# Patient Record
Sex: Female | Born: 1975 | Race: White | Hispanic: No | Marital: Married | Smoking: Never smoker
Health system: Southern US, Community
[De-identification: ages and names within clinical notes are randomized; demographics above are authoritative.]

## PROBLEM LIST (undated history)

## (undated) HISTORY — PX: COSMETIC SURGERY: SHX468

---

## 2011-01-27 ENCOUNTER — Encounter: Payer: Self-pay | Admitting: *Deleted

## 2011-01-27 ENCOUNTER — Emergency Department (HOSPITAL_COMMUNITY)
Admission: EM | Admit: 2011-01-27 | Discharge: 2011-01-28 | Disposition: A | Discharge status: Hospice | Payer: Managed Care, Other (non HMO) | Attending: Emergency Medicine | Admitting: Emergency Medicine

## 2011-01-27 DIAGNOSIS — O26859 Spotting complicating pregnancy, unspecified trimester: Secondary | ICD-10-CM | POA: Insufficient documentation

## 2011-01-27 DIAGNOSIS — R110 Nausea: Secondary | ICD-10-CM | POA: Insufficient documentation

## 2011-01-27 DIAGNOSIS — Z3490 Encounter for supervision of normal pregnancy, unspecified, unspecified trimester: Secondary | ICD-10-CM

## 2011-01-27 DIAGNOSIS — N939 Abnormal uterine and vaginal bleeding, unspecified: Secondary | ICD-10-CM

## 2011-01-27 DIAGNOSIS — R300 Dysuria: Secondary | ICD-10-CM | POA: Insufficient documentation

## 2011-01-27 DIAGNOSIS — R109 Unspecified abdominal pain: Secondary | ICD-10-CM | POA: Insufficient documentation

## 2011-01-27 NOTE — ED Notes (Addendum)
~   [redacted] weeks pregnant, 1st noticed spotting Saturday morning, intermittant and small amount, describes as pink, last ~ 1 hr ago, also reports intermittant abd & back pain, also nausea & HA. (denies: vd or fever), took tylenol PTA. No prenatal care yet, 1st appt this Tuesday. BP noted to be high.

## 2011-01-28 ENCOUNTER — Emergency Department (HOSPITAL_COMMUNITY): Payer: Managed Care, Other (non HMO)

## 2011-01-28 LAB — WET PREP, GENITAL
Trich, Wet Prep: NONE SEEN
Yeast Wet Prep HPF POC: NONE SEEN

## 2011-01-28 LAB — URINALYSIS, ROUTINE W REFLEX MICROSCOPIC
Bilirubin Urine: NEGATIVE
Ketones, ur: NEGATIVE mg/dL
Leukocytes, UA: NEGATIVE
Nitrite: NEGATIVE
Specific Gravity, Urine: 1.003 — ABNORMAL LOW (ref 1.005–1.030)
Urobilinogen, UA: 0.2 mg/dL (ref 0.0–1.0)

## 2011-01-28 LAB — GC/CHLAMYDIA PROBE AMP, GENITAL
Chlamydia, DNA Probe: NEGATIVE
GC Probe Amp, Genital: NEGATIVE

## 2011-01-28 LAB — CBC
Hemoglobin: 12.4 g/dL (ref 12.0–15.0)
MCH: 31 pg (ref 26.0–34.0)
MCHC: 34.1 g/dL (ref 30.0–36.0)
Platelets: 208 10*3/uL (ref 150–400)
RBC: 4 MIL/uL (ref 3.87–5.11)

## 2011-01-28 LAB — HCG, QUANTITATIVE, PREGNANCY: hCG, Beta Chain, Quant, S: 14374 m[IU]/mL — ABNORMAL HIGH (ref <5)

## 2011-01-28 LAB — ABO/RH: ABO/RH(D): A POS

## 2011-01-28 MED ORDER — ACETAMINOPHEN 325 MG PO TABS
650.0000 mg | ORAL_TABLET | Freq: Once | ORAL | Status: AC
  Administered 2011-01-28: 650 mg via ORAL
  Filled 2011-01-28: qty 2

## 2011-01-28 MED ORDER — ACETAMINOPHEN 500 MG PO TABS
500.0000 mg | ORAL_TABLET | Freq: Once | ORAL | Status: DC
  Filled 2011-01-28: qty 1

## 2011-01-28 NOTE — ED Notes (Signed)
Patient is resting comfortably. 

## 2011-01-28 NOTE — ED Notes (Signed)
Family at bedside. 

## 2011-01-28 NOTE — ED Provider Notes (Signed)
History     CSN: [id number] Arrival date & time: 01/27/2011 11:21 PM   First MD Initiated Contact with Patient 01/27/11 2350      Chief Complaint  Patient presents with  . Vaginal Bleeding    (Consider location/radiation/quality/duration/timing/severity/associated sxs/prior treatment) HPI Comments: 35 year old G3 P2 002 at approximately [redacted] weeks gestation presents with intermittent vaginal spotting over the last 24 hours, gradually getting worse, associated with dysuria and abdominal pain and nausea. States she has not had an OB/GYN appointment at this time  Patient is a 35 y.o. female presenting with vaginal bleeding. The history is provided by the patient and the spouse. The history is limited by a language barrier.  Vaginal Bleeding This is a new problem. The current episode started yesterday. The problem occurs hourly. The problem has been gradually worsening. Associated symptoms include abdominal pain (Suprapubic). Pertinent negatives include no chest pain, no headaches and no shortness of breath. Exacerbated by: Urination. The symptoms are relieved by nothing. She has tried acetaminophen for the symptoms. The treatment provided no relief.    History reviewed. No pertinent past medical history.  Past Surgical History  Procedure Date  . Cosmetic surgery     Family History  Problem Relation Age of Onset  . Diabetes Other     History  Substance Use Topics  . Smoking status: Never Smoker   . Smokeless tobacco: Not on file  . Alcohol Use: No    OB History    Grav Para Term Preterm Abortions TAB SAB Ect Mult Living                  Review of Systems  Respiratory: Negative for shortness of breath.   Cardiovascular: Negative for chest pain.  Gastrointestinal: Positive for abdominal pain (Suprapubic).  Genitourinary: Positive for vaginal bleeding.  Neurological: Negative for headaches.  All other systems reviewed and are negative.    Allergies  Review of patient's  allergies indicates no known allergies.  Home Medications   Current Outpatient Rx  Name Route Sig Dispense Refill  . ACETAMINOPHEN 500 MG PO TABS Oral Take 500 mg by mouth every 6 (six) hours as needed. For pain     . PRENATAL 1 PO Oral Take 1 tablet by mouth daily.        BP 158/81  Pulse 73  Temp(Src) 97.8 F (36.6 C) (Oral)  Resp 18  SpO2 100%  LMP 11/30/2010  Physical Exam  Nursing note and vitals reviewed. Constitutional: She appears well-developed and well-nourished. No distress.  HENT:  Head: Normocephalic and atraumatic.  Mouth/Throat: Oropharynx is clear and moist. No oropharyngeal exudate.  Eyes: Conjunctivae and EOM are normal. Pupils are equal, round, and reactive to light. Right eye exhibits no discharge. Left eye exhibits no discharge. No scleral icterus.  Neck: Normal range of motion. Neck supple. No JVD present. No thyromegaly present.  Cardiovascular: Normal rate, regular rhythm, normal heart sounds and intact distal pulses.  Exam reveals no gallop and no friction rub.   No murmur heard. Pulmonary/Chest: Effort normal and breath sounds normal. No respiratory distress. She has no wheezes. She has no rales.  Abdominal: Soft. Bowel sounds are normal. She exhibits no distension and no mass. There is tenderness ( Mild tenderness to palpation in the suprapubic area).       No right lower quadrant, right upper quadrant tenderness to palpation. Non-peritoneal, no guarding  Genitourinary:       No CVA tenderness.  Chaperone present for pelvic exam, no  cervical motion tenderness, mild bilateral adnexal tenderness, spot of blood at the cervical os, no discharge, cervical os is closed.  Musculoskeletal: Normal range of motion. She exhibits no edema and no tenderness.  Lymphadenopathy:    She has no cervical adenopathy.  Neurological: She is alert. Coordination normal.  Skin: Skin is warm and dry. No rash noted. No erythema.  Psychiatric: She has a normal mood and affect.  Her behavior is normal.    ED Course  Procedures (including critical care time)  Labs Reviewed  HCG, QUANTITATIVE, PREGNANCY - Abnormal; Notable for the following:    hCG, Beta Chain, Quant, S 14374 (*)    All other components within normal limits  URINALYSIS, ROUTINE W REFLEX MICROSCOPIC - Abnormal; Notable for the following:    Color, Urine STRAW (*)    Specific Gravity, Urine 1.003 (*)    Hgb urine dipstick SMALL (*)    All other components within normal limits  WET PREP, GENITAL - Abnormal; Notable for the following:    Clue Cells, Wet Prep FEW (*)    WBC, Wet Prep HPF POC FEW (*)    All other components within normal limits  ABO/RH  CBC  URINE MICROSCOPIC-ADD ON  URINE CULTURE  GC/CHLAMYDIA PROBE AMP, GENITAL   Results for orders placed during the hospital encounter of 01/27/11  ABO/RH      Component Value Range   ABO/RH(D) A POS    CBC      Component Value Range   WBC 7.2  4.0 - 10.5 (K/uL)   RBC 4.00  3.87 - 5.11 (MIL/uL)   Hemoglobin 12.4  12.0 - 15.0 (g/dL)   HCT 45.4  09.8 - 11.9 (%)   MCV 91.0  78.0 - 100.0 (fL)   MCH 31.0  26.0 - 34.0 (pg)   MCHC 34.1  30.0 - 36.0 (g/dL)   RDW 14.7  82.9 - 56.2 (%)   Platelets 208  150 - 400 (K/uL)  HCG, QUANTITATIVE, PREGNANCY      Component Value Range   hCG, Beta Chain, Quant, S 14374 (*) <5 (mIU/mL)  URINALYSIS, ROUTINE W REFLEX MICROSCOPIC      Component Value Range   Color, Urine STRAW (*) YELLOW    Appearance CLEAR  CLEAR    Specific Gravity, Urine 1.003 (*) 1.005 - 1.030    pH 7.0  5.0 - 8.0    Glucose, UA NEGATIVE  NEGATIVE (mg/dL)   Hgb urine dipstick SMALL (*) NEGATIVE    Bilirubin Urine NEGATIVE  NEGATIVE    Ketones, ur NEGATIVE  NEGATIVE (mg/dL)   Protein, ur NEGATIVE  NEGATIVE (mg/dL)   Urobilinogen, UA 0.2  0.0 - 1.0 (mg/dL)   Nitrite NEGATIVE  NEGATIVE    Leukocytes, UA NEGATIVE  NEGATIVE   WET PREP, GENITAL      Component Value Range   Yeast, Wet Prep NONE SEEN  NONE SEEN    Trich, Wet Prep NONE  SEEN  NONE SEEN    Clue Cells, Wet Prep FEW (*) NONE SEEN    WBC, Wet Prep HPF POC FEW (*) NONE SEEN   URINE MICROSCOPIC-ADD ON      Component Value Range   Squamous Epithelial / LPF RARE  RARE    WBC, UA 0-2  <3 (WBC/hpf)   RBC / HPF 0-2  <3 (RBC/hpf)   Bacteria, UA RARE  RARE     US Ob Transvaginal  01/28/2011  *RADIOLOGY REPORT*  Clinical Data: Pregnant, pain, evaluate for ectopic pregnancy  OBSTETRIC <14 WK [place] AND TRANSVAGINAL OB US  Technique:  Both transabdominal and transvaginal ultrasound examinations were performed for complete evaluation of the gestation as well as the maternal uterus, adnexal regions, and pelvic cul-de-sac.  Transvaginal technique was performed to assess early pregnancy.  Comparison:  None.  Intrauterine gestational sac:  Visualized/normal in shape. Yolk sac: Present Embryo: Present Cardiac Activity: Documented Heart Rate: 171 bpm  CRL: 14.8   mm  7   w  6   d        [place] EDC: 09/10/2011  Maternal uterus/adnexae: Ovaries have a normal sonographic appearance, measuring 3.8 x 1.8 x 1.4 cm on the right and 2.5 x 1.5 x 1.6 cm on the left.  Two uterine fibroids are present, the largest of which is subserosal, measuring 5.0 x 3.9 cm.  The other appears to be intramural.  Small amount of free fluid.  No adnexal mass.  IMPRESSION: Single intrauterine gestation identified, with estimated age of 7 weeks 6 days by crown-rump length.  Cardiac activity documented.  Uterine fibroids.  Small amount of free fluid.  Original Report Authenticated By:        1. Abnormal vaginal bleeding   2. Normal IUP (intrauterine pregnancy) on prenatal ultrasound       MDM  Well-appearing female with focal suprapubic tenderness, bedside ultrasound revealing likely IUP, lab work, urinalysis, pelvic exam revealing nonspecific bilateral adnexal tenderness. Ultrasound pending to confirm IUP rule out ectopic pregnancy  Patient has improved with Tylenol, vital signs normal, intrauterine pregnancy  confirmed by official ultrasound. Urinalysis without signs of infection. Patient given followup instructions and understanding expressed by patient and family     Vida Roller, MD 01/28/11 [phone]

## 2011-01-29 LAB — URINE CULTURE

## 2011-02-03 ENCOUNTER — Emergency Department (HOSPITAL_COMMUNITY): Payer: Managed Care, Other (non HMO)

## 2011-02-03 ENCOUNTER — Emergency Department (HOSPITAL_COMMUNITY)
Admission: EM | Admit: 2011-02-03 | Discharge: 2011-02-03 | Disposition: A | Discharge status: Home / Self Care | Payer: Managed Care, Other (non HMO) | Attending: Emergency Medicine | Admitting: Emergency Medicine

## 2011-02-03 ENCOUNTER — Encounter (HOSPITAL_COMMUNITY): Payer: Self-pay | Admitting: Emergency Medicine

## 2011-02-03 DIAGNOSIS — R109 Unspecified abdominal pain: Secondary | ICD-10-CM | POA: Insufficient documentation

## 2011-02-03 DIAGNOSIS — O039 Complete or unspecified spontaneous abortion without complication: Secondary | ICD-10-CM

## 2011-02-03 LAB — URINE MICROSCOPIC-ADD ON

## 2011-02-03 LAB — URINALYSIS, ROUTINE W REFLEX MICROSCOPIC
Glucose, UA: NEGATIVE mg/dL
Protein, ur: NEGATIVE mg/dL
Specific Gravity, Urine: 1.004 — ABNORMAL LOW (ref 1.005–1.030)
pH: 7 (ref 5.0–8.0)

## 2011-02-03 LAB — POCT I-STAT, CHEM 8
Calcium, Ion: 1.23 mmol/L (ref 1.12–1.32)
Creatinine, Ser: 0.7 mg/dL (ref 0.50–1.10)
Glucose, Bld: 111 mg/dL — ABNORMAL HIGH (ref 70–99)
Hemoglobin: 13.6 g/dL (ref 12.0–15.0)
Potassium: 4 mEq/L (ref 3.5–5.1)

## 2011-02-03 MED ORDER — HYDROCODONE-ACETAMINOPHEN 5-325 MG PO TABS
2.0000 | ORAL_TABLET | Freq: Once | ORAL | Status: AC
  Administered 2011-02-03: 2 via ORAL
  Filled 2011-02-03: qty 2

## 2011-02-03 MED ORDER — HYDROCODONE-ACETAMINOPHEN 5-325 MG PO TABS: 1.0000 | ORAL_TABLET | ORAL | Status: AC | PRN

## 2011-02-03 NOTE — ED Notes (Signed)
Pt c/o lower abd/back pain x1 day, abnormal vag. Bleeding x6 days, and passing lg blood clots x2-3 days, pt LNMP 11/30/2010, pt sts she is [redacted] wks pregnant

## 2011-02-03 NOTE — ED Notes (Signed)
C/o increased abd pain/cramping

## 2011-02-03 NOTE — ED Provider Notes (Signed)
History     CSN: [id number] Arrival date & time: 02/03/2011  5:20 PM   First MD Initiated Contact with Patient 02/03/11 1845      Chief Complaint  Patient presents with  . Vaginal Bleeding     Patient is a 35 y.o. female presenting with vaginal bleeding. The history is provided by the patient and the spouse.  Vaginal Bleeding This is a new problem. The current episode started in the past 7 days. The problem occurs constantly. The problem has been gradually worsening. Associated symptoms include abdominal pain. Pertinent negatives include no nausea or vomiting. She has tried nothing for the symptoms. The treatment provided no relief.  Reports she is approximately [redacted] weeks pregnant. Seen here on the 19th for spotting and vaginal bleeding. Reports increased vaginal bleeding over the last 24 hours it has been associated with large clots. Increased bleeding has been associated with increased lower abdominal cramping.   History reviewed. No pertinent past medical history.  Past Surgical History  Procedure Date  . Cosmetic surgery     Family History  Problem Relation Age of Onset  . Diabetes Other     History  Substance Use Topics  . Smoking status: Never Smoker   . Smokeless tobacco: Not on file  . Alcohol Use: No    OB History    Grav Para Term Preterm Abortions TAB SAB Ect Mult Living   3               Review of Systems  Constitutional: Negative.   HENT: Negative.   Eyes: Negative.   Respiratory: Negative.   Cardiovascular: Negative.   Gastrointestinal: Positive for abdominal pain. Negative for nausea and vomiting.  Genitourinary: Positive for vaginal bleeding.  Musculoskeletal: Negative.   Skin: Negative.   Neurological: Negative.   Hematological: Negative.   Psychiatric/Behavioral: Negative.     Allergies  Review of patient's allergies indicates no known allergies.  Home Medications   Current Outpatient Rx  Name Route Sig Dispense Refill  .  ACETAMINOPHEN 500 MG PO TABS Oral Take 500 mg by mouth every 6 (six) hours as needed. For pain     . PRENATAL 1 PO Oral Take 1 tablet by mouth daily.        BP 158/90  Pulse 101  Temp(Src) 97.3 F (36.3 C) (Oral)  Resp 22  SpO2 99%  LMP 11/30/2010  Physical Exam  Constitutional: She is oriented to person, place, and time. She appears well-developed and well-nourished.  HENT:  Head: Normocephalic and atraumatic.  Eyes: Conjunctivae are normal.  Neck: Neck supple.  Cardiovascular: Normal rate and regular rhythm.   Pulmonary/Chest: Effort normal and breath sounds normal.  Abdominal: Soft. Bowel sounds are normal.  Genitourinary: Pelvic exam was performed with patient supine.       Moderate amount br red bleeding noted w/ small clots and small fragments of tissue like material c/w POC.  Musculoskeletal: Normal range of motion.  Neurological: She is alert and oriented to person, place, and time.  Skin: Skin is warm and dry.  Psychiatric: She has a normal mood and affect.    ED Course  Procedures U/S shows no IUP as compared w/ U/S on 01/28/2011. Thickened endometrium. Will consult w/ GYN on call for plan. Findings discussed w/ pt and spouse.   Spoke w/ Dr Leda Quail who has recommended d/c home w/ bleeding precautions and close f/u w/ GYN she was scheduled to see. Discussed plan w/ pt and spouse who are  agreeable w/ plan.  Labs Reviewed  URINALYSIS, ROUTINE W REFLEX MICROSCOPIC - Abnormal; Notable for the following:    Specific Gravity, Urine 1.004 (*)    Hgb urine dipstick LARGE (*)    Leukocytes, UA TRACE (*)    All other components within normal limits  POCT I-STAT, CHEM 8 - Abnormal; Notable for the following:    Glucose, Bld 111 (*)    All other components within normal limits  URINE MICROSCOPIC-ADD ON - Abnormal; Notable for the following:    Squamous Epithelial / LPF FEW (*)    All other components within normal limits  URINE CULTURE  I-STAT, CHEM 8   US Ob Comp  Less 14 Wks  02/03/2011  *RADIOLOGY REPORT*  Clinical Data: Vaginal bleeding.  Cramping.  Pregnant.  OBSTETRIC <14 WK [place] AND TRANSVAGINAL OB US  Technique:  Both transabdominal and transvaginal ultrasound examinations were performed for complete evaluation of the gestation as well as the maternal uterus, adnexal regions, and pelvic cul-de-sac.  Transvaginal technique was performed to assess early pregnancy.  Comparison: 01/28/2011.  It was necessary to proceed with endovaginal exam following the transabdominal exam to visualize the endometrium.  Findings: There is no intrauterine gestational sac identified compatible with missed abortion.  Debris is noted within the endometrial canal. Endometrial thickness measures up to 11 mm. Posterior fundal fibroid is present measuring 49 mm x 38 mm x 44 mm.  No adnexal mass is identified.  Right ovary measures 28 mm x 16 mm x 22 mm.  Possible small right involuting corpus luteum cyst. The left ovary measures 20 mm x 11 mm x 15 mm.  IMPRESSION:  No intrauterine gestational sac identified, which was present on the recent prior examination.  Findings are compatible with missed abortion / failed early pregnancy. Endometrial thickening measuring up to 11 mm.  Retained products of conception not excluded.  Original Report Authenticated By: Andreas Newport, M.D.   US Ob Transvaginal  02/03/2011  *RADIOLOGY REPORT*  Clinical Data: Vaginal bleeding.  Cramping.  Pregnant.  OBSTETRIC <14 WK [place] AND TRANSVAGINAL OB US  Technique:  Both transabdominal and transvaginal ultrasound examinations were performed for complete evaluation of the gestation as well as the maternal uterus, adnexal regions, and pelvic cul-de-sac.  Transvaginal technique was performed to assess early pregnancy.  Comparison: 01/28/2011.  It was necessary to proceed with endovaginal exam following the transabdominal exam to visualize the endometrium.  Findings: There is no intrauterine gestational sac identified  compatible with missed abortion.  Debris is noted within the endometrial canal. Endometrial thickness measures up to 11 mm. Posterior fundal fibroid is present measuring 49 mm x 38 mm x 44 mm.  No adnexal mass is identified.  Right ovary measures 28 mm x 16 mm x 22 mm.  Possible small right involuting corpus luteum cyst. The left ovary measures 20 mm x 11 mm x 15 mm.  IMPRESSION:  No intrauterine gestational sac identified, which was present on the recent prior examination.  Findings are compatible with missed abortion / failed early pregnancy. Endometrial thickening measuring up to 11 mm.  Retained products of conception not excluded.  Original Report Authenticated By: Andreas Newport, M.D.     No diagnosis found.    MDM  HPI, clinical course and PE c/w complete miscarriage. Pt is A +.        Leanne Chang, NP 02/03/11 [phone]

## 2011-02-03 NOTE — ED Notes (Signed)
C/o vaginal bleeding since Tuesday.  Reports clots since yesterday and feeling like she is having contractions since yesterday.  Pt reports being [redacted] weeks pregnant.

## 2011-02-04 NOTE — ED Provider Notes (Signed)
Medical screening examination/treatment/procedure(s) were performed by non-physician practitioner and as supervising physician I was immediately available for consultation/collaboration.  Juliet Rude. Rubin Payor, MD 02/04/11 1610

## 2011-02-05 LAB — URINE CULTURE
Colony Count: 6000
Culture  Setup Time: 201211260416

## 2012-04-15 ENCOUNTER — Ambulatory Visit: Payer: Self-pay | Admitting: Obstetrics and Gynecology

## 2012-04-15 LAB — CBC WITH DIFFERENTIAL/PLATELET
Basophil #: 0 10*3/uL (ref 0.0–0.1)
Basophil %: 0.6 %
Eosinophil #: 0.1 10*3/uL (ref 0.0–0.7)
Eosinophil %: 1.1 %
Lymphocyte #: 1.5 10*3/uL (ref 1.0–3.6)
Lymphocyte %: 19.6 %
Monocyte #: 0.4 x10 3/mm (ref 0.2–0.9)
Neutrophil #: 5.8 10*3/uL (ref 1.4–6.5)
Platelet: 220 10*3/uL (ref 150–440)

## 2012-04-16 ENCOUNTER — Inpatient Hospital Stay: Payer: Self-pay | Admitting: Obstetrics and Gynecology

## 2012-04-17 LAB — CBC WITH DIFFERENTIAL/PLATELET
Basophil %: 0.7 %
HCT: 20.7 % — ABNORMAL LOW (ref 35.0–47.0)
Monocyte %: 4.4 %
Neutrophil %: 80.2 %
Platelet: 165 10*3/uL (ref 150–440)
RBC: 2.18 10*6/uL — ABNORMAL LOW (ref 3.80–5.20)

## 2012-04-18 LAB — CBC WITH DIFFERENTIAL/PLATELET
Basophil #: 0 10*3/uL (ref 0.0–0.1)
Eosinophil #: 0.1 10*3/uL (ref 0.0–0.7)
Eosinophil %: 1.3 %
HGB: 7.1 g/dL — ABNORMAL LOW (ref 12.0–16.0)
Lymphocyte #: 1.7 10*3/uL (ref 1.0–3.6)

## 2012-09-04 IMAGING — US US OB COMP LESS 14 WK
1 series · 14 of 28 positions shown · non-contrast
Comparison: None.

CLINICAL DATA: Pregnant, pain, evaluate for ectopic pregnancy

OBSTETRIC <14 WK US AND TRANSVAGINAL OB US
TECHNIQUE: Both transabdominal and transvaginal ultrasound
examinations were performed for complete evaluation of the
gestation as well as the maternal uterus, adnexal regions, and
pelvic cul-de-sac.  Transvaginal technique was performed to assess
early pregnancy.

[Series 1: us ob comp less 14 wk · 0.26mm/px · 14 of 38 slices shown]
[im 2/38]
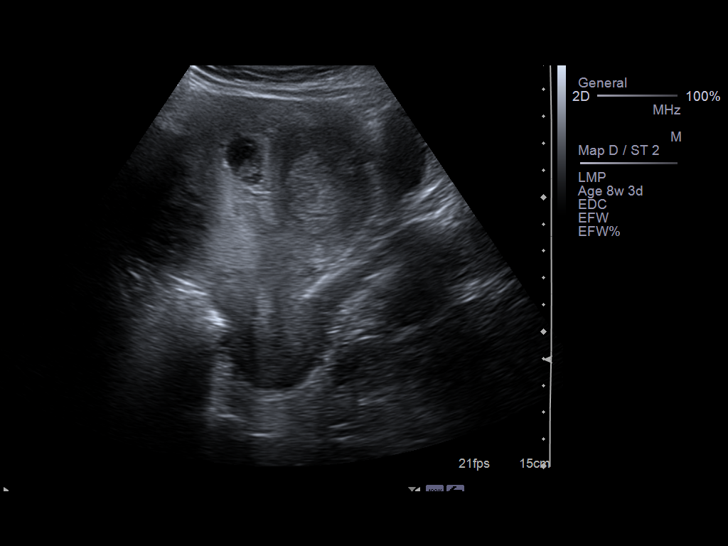
[im 5/38]
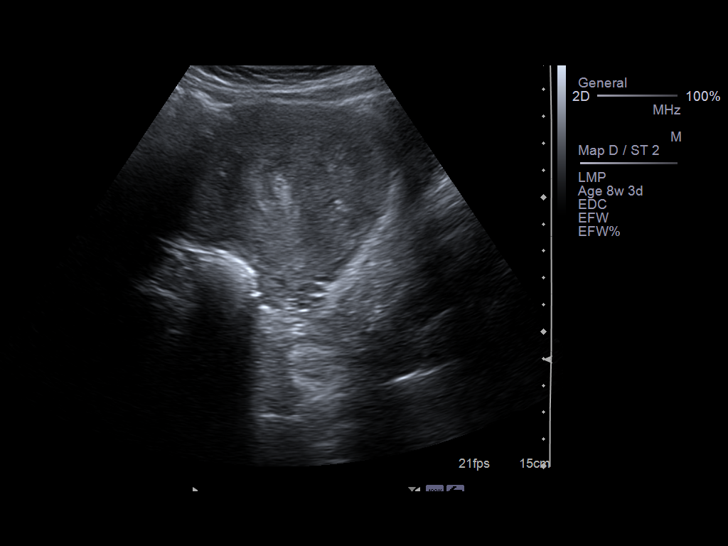
[im 7/38]
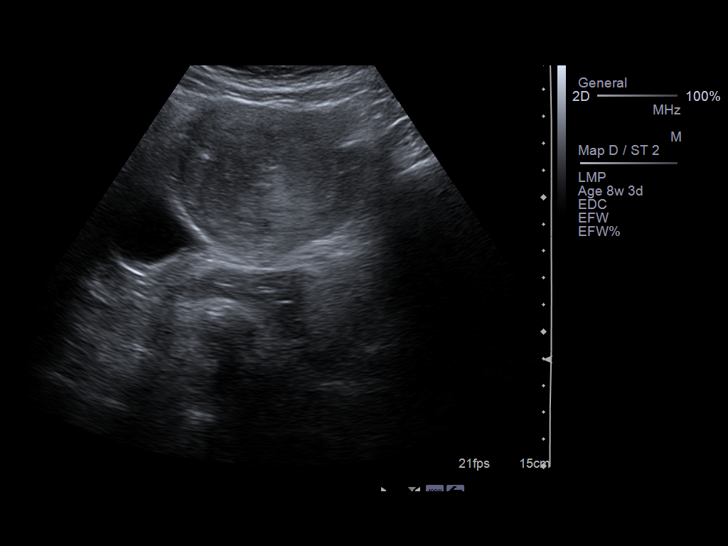
[im 10/38]
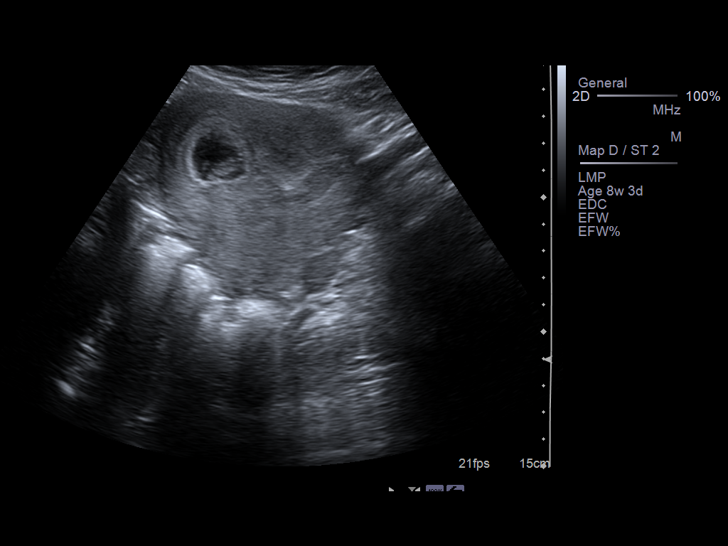
[im 13/38]
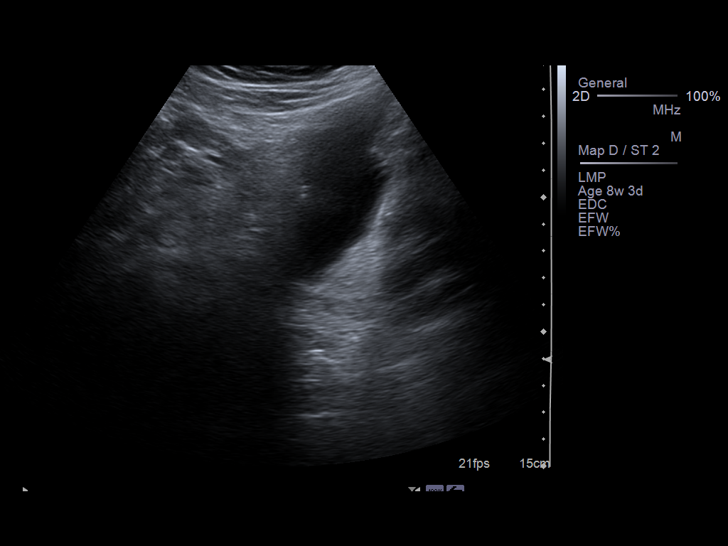
[im 16/38]
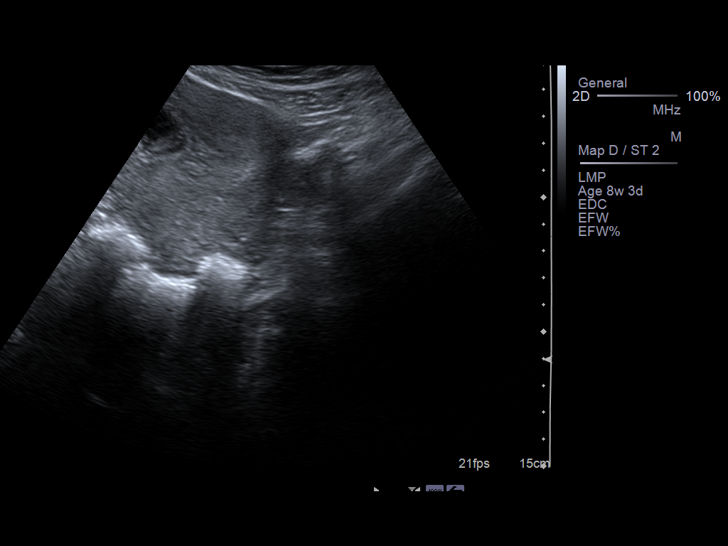
[im 18/38]
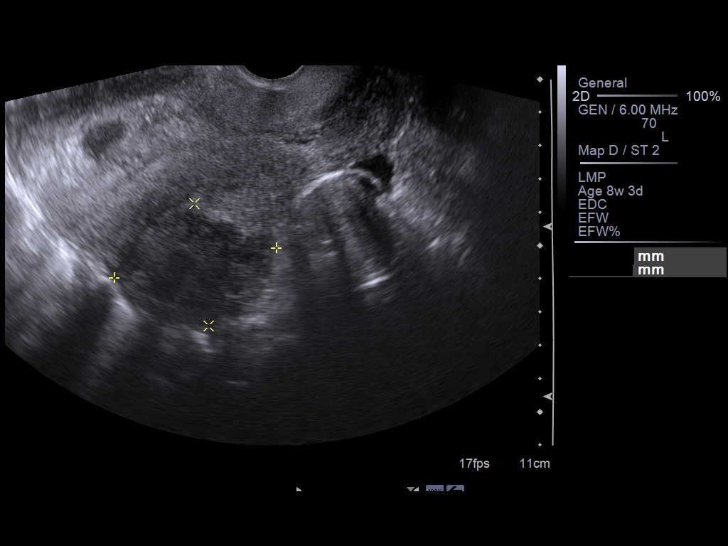
[im 21/38]
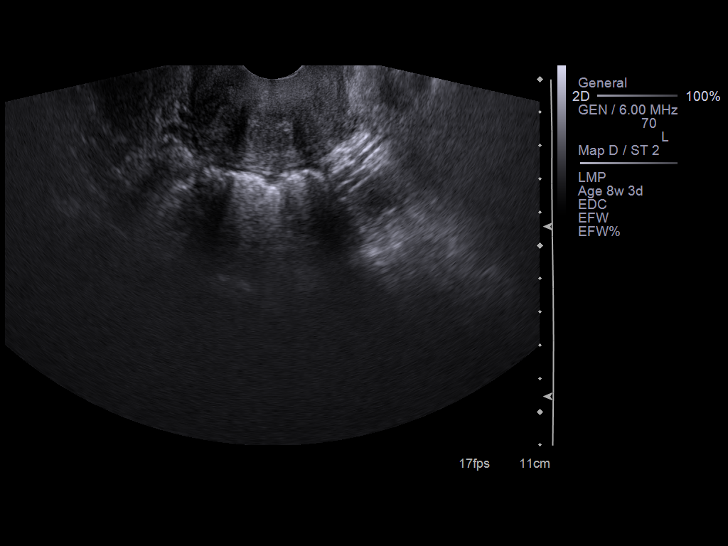
[im 24/38]
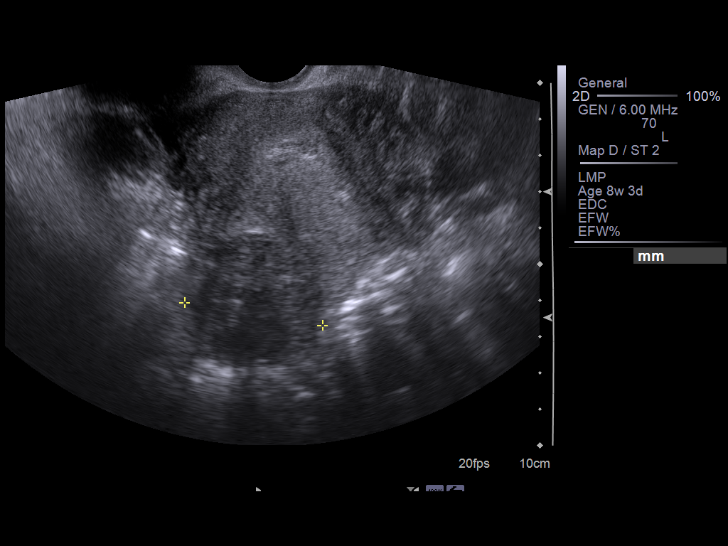
[im 27/38]
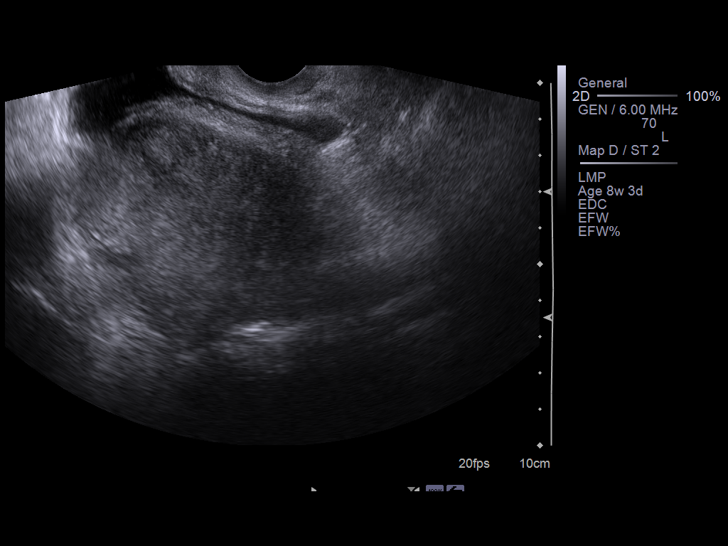
[im 29/38]
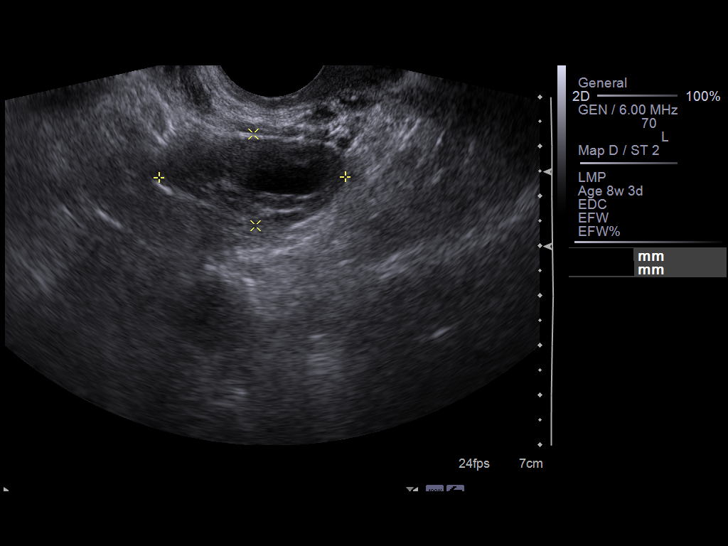
[im 32/38]
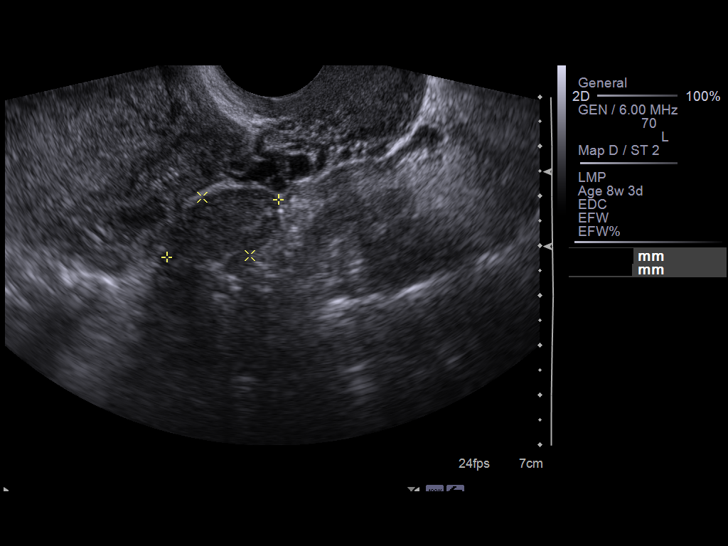
[im 35/38]
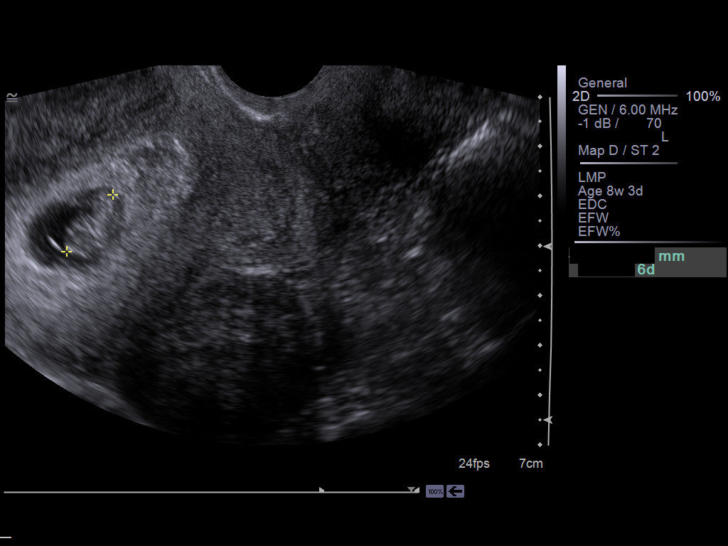
[im 38/38]
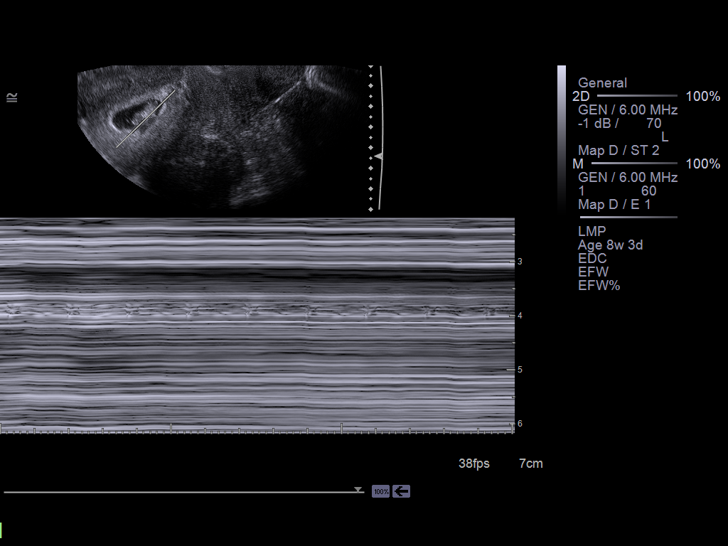

[14 of 28 positions shown; findings below may reference images not displayed]

Intrauterine gestational sac:  Visualized/normal in shape.
Yolk sac: Present
Embryo: Present
Cardiac Activity: Documented
Heart Rate: 171 bpm

CRL: 14.8   mm  7   w  6   d        US EDC: 09/10/2011

Maternal uterus/adnexae:
Ovaries have a normal sonographic appearance, measuring 3.8 x 1.8 x
1.4 cm on the right and 2.5 x 1.5 x 1.6 cm on the left.  Two
uterine fibroids are present, the largest of which is subserosal,
measuring 5.0 x 3.9 cm.  The other appears to be intramural.

Small amount of free fluid.  No adnexal mass.
IMPRESSION: Single intrauterine gestation identified, with estimated age of 7
weeks 6 days by crown-rump length.  Cardiac activity documented.

Uterine fibroids.  Small amount of free fluid.

## 2012-09-10 IMAGING — US US OB TRANSVAGINAL
1 series · 13 of 28 positions shown · non-contrast
Comparison: 01/28/2011.

CLINICAL DATA: Vaginal bleeding.  Cramping.  Pregnant.

OBSTETRIC <14 WK US AND TRANSVAGINAL OB US
TECHNIQUE: Both transabdominal and transvaginal ultrasound
examinations were performed for complete evaluation of the
gestation as well as the maternal uterus, adnexal regions, and
pelvic cul-de-sac.  Transvaginal technique was performed to assess
early pregnancy.

[Series 1: us ob transvaginal · 0.26mm/px · 13 of 39 slices shown]
[im 2/39]
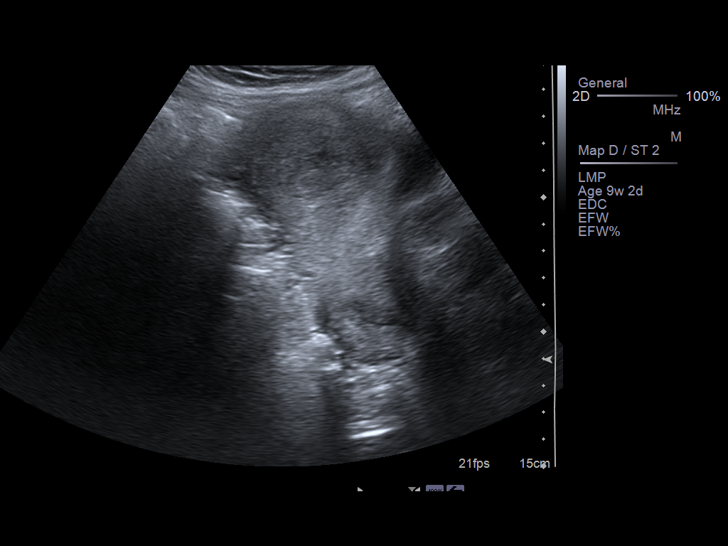
[im 5/39]
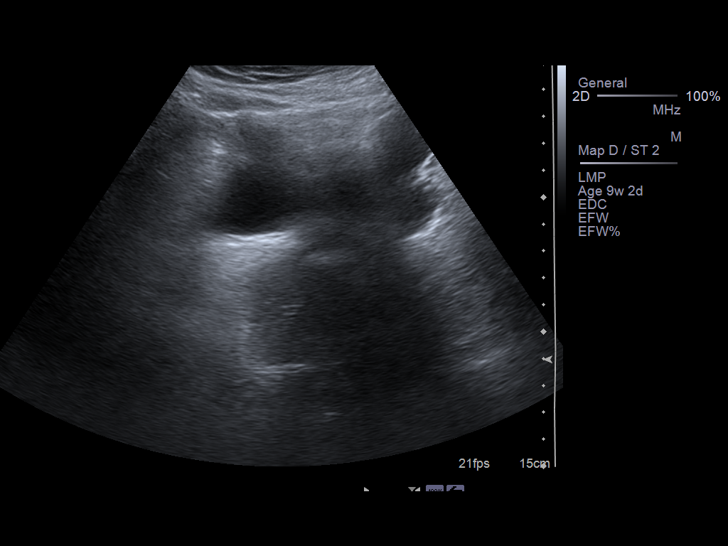
[im 8/39]
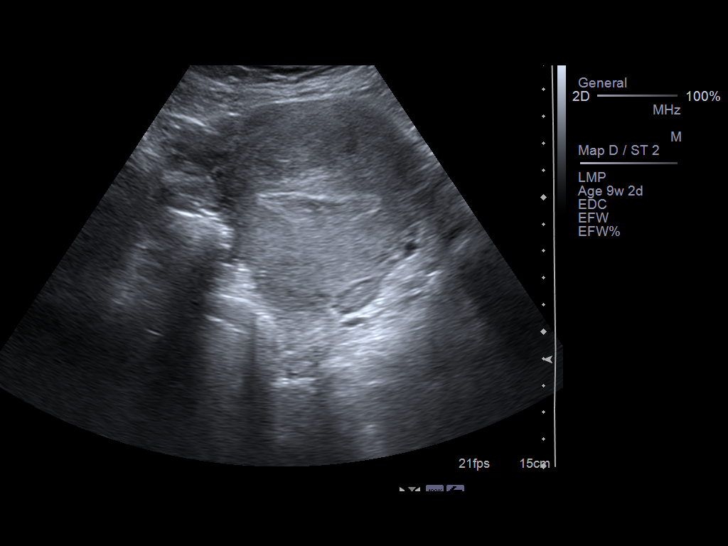
[im 10/39]
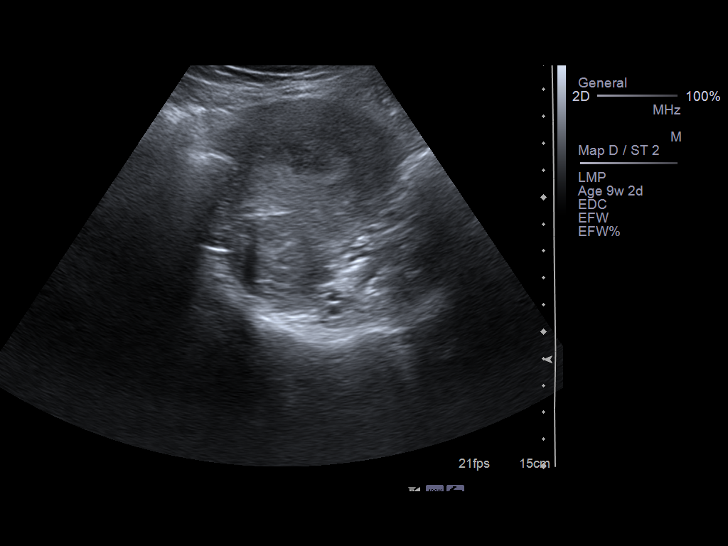
[im 13/39]
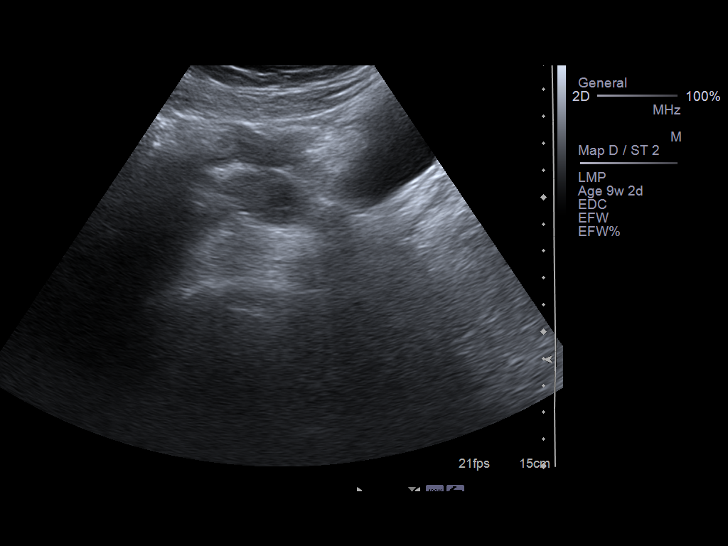
[im 16/39]
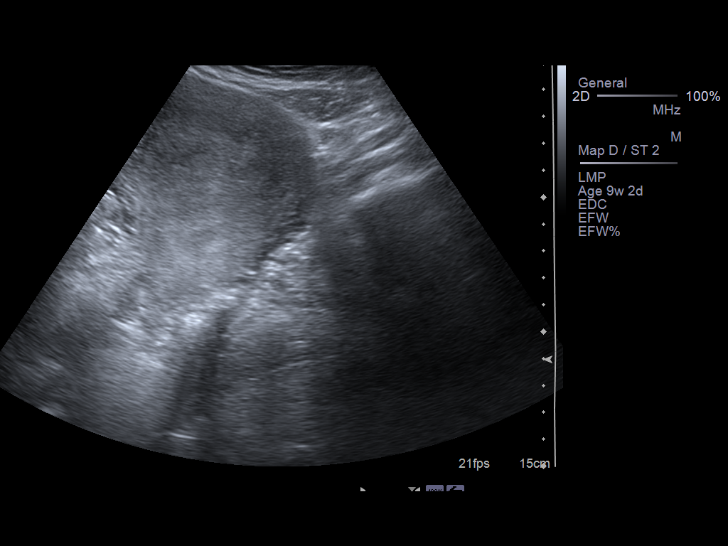
[im 20/39]
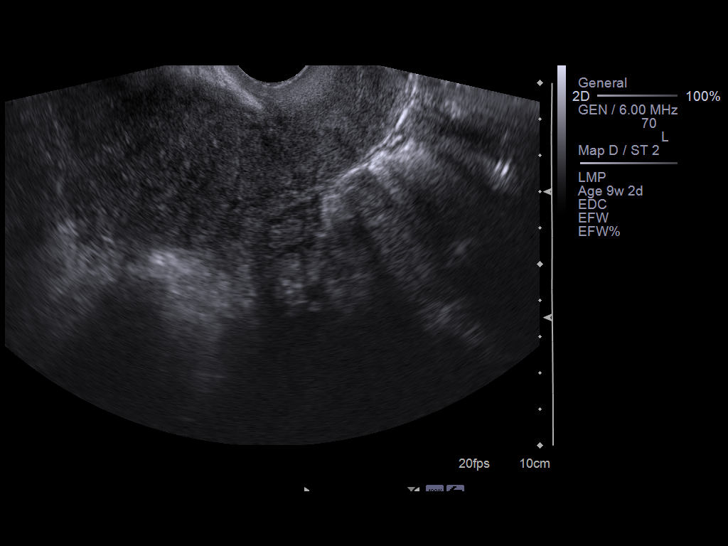
[im 23/39]
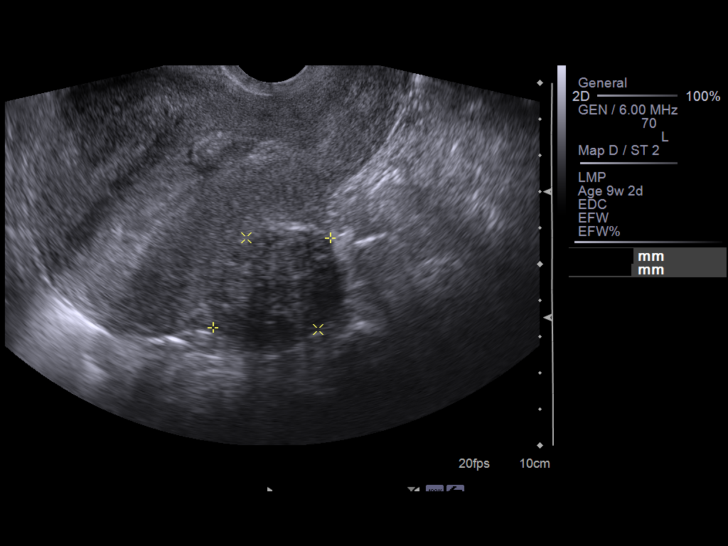
[im 26/39]
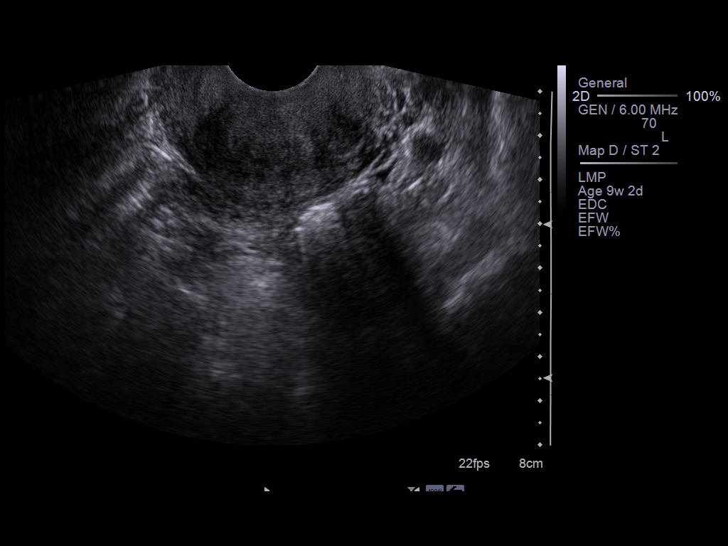
[im 29/39]
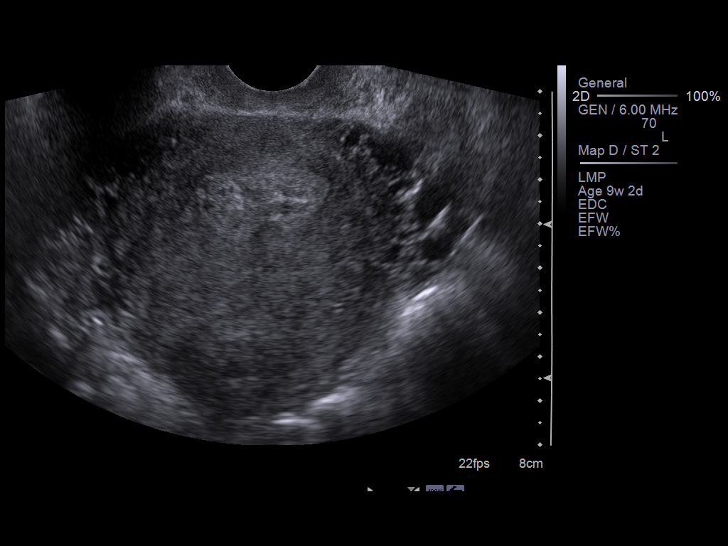
[im 31/39]
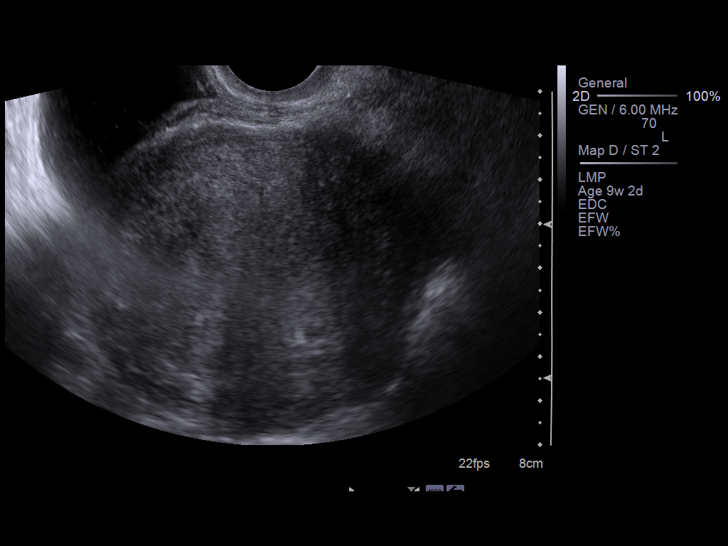
[im 34/39]
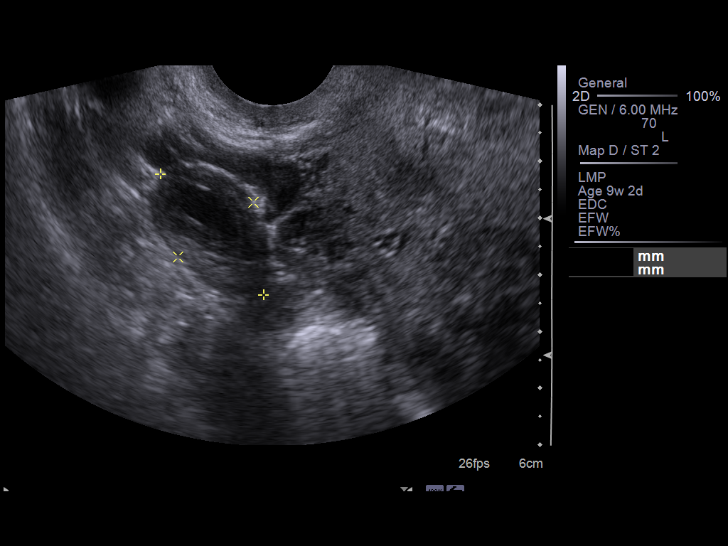
[im 37/39]
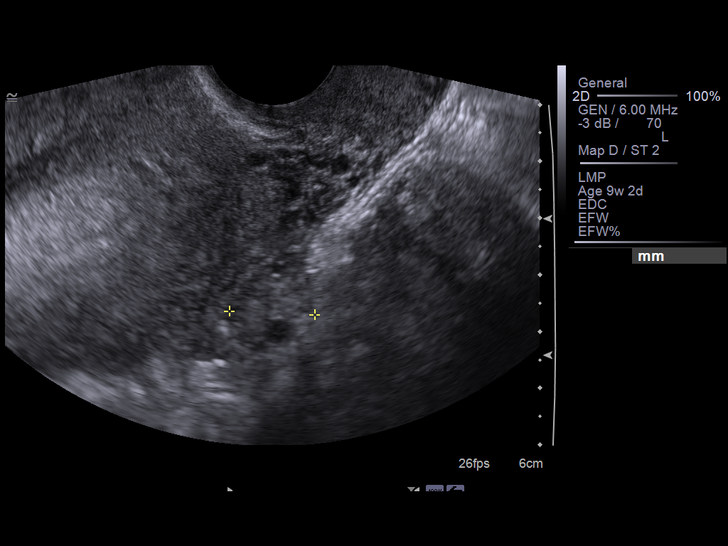

[13 of 28 positions shown; findings below may reference images not displayed]

It was necessary to proceed with endovaginal exam following the
transabdominal exam to visualize the endometrium.
FINDINGS: There is no intrauterine gestational sac identified
compatible with missed abortion.  Debris is noted within the
endometrial canal. Endometrial thickness measures up to 11 mm.
Posterior fundal fibroid is present measuring 49 mm x 38 mm x 44
mm.  No adnexal mass is identified.  Right ovary measures 28 mm x
16 mm x 22 mm.  Possible small right involuting corpus luteum cyst.
The left ovary measures 20 mm x 11 mm x 15 mm.
IMPRESSION: No intrauterine gestational sac identified, which was present on
the recent prior examination.  Findings are compatible with missed
abortion / failed early pregnancy. Endometrial thickening measuring
up to 11 mm.  Retained products of conception not excluded.

## 2014-01-10 ENCOUNTER — Encounter (HOSPITAL_COMMUNITY): Payer: Self-pay | Admitting: Emergency Medicine

## 2014-07-01 NOTE — Op Note (Signed)
PATIENT NAME:  Dorothy Jensen, Dorothy Jensen MR#:  [id number] DATE OF BIRTH:  [birth year]  DATE OF PROCEDURE:  04/16/2012  PREOPERATIVE DIAGNOSES: 721. 39 year old gravida 4, para 2-0-1-2 at [redacted] weeks gestation.  2. History of prior birth trauma and what sounds like possible shoulder dystocia.   POSTOPERATIVE DIAGNOSES:  771. 39 year old gravida 4, para 2-0-1-2 at [redacted] weeks gestation.  2. History of prior birth trauma and what sounds like possible shoulder dystocia.   PROCEDURE PERFORMED: Low transverse cesarean section via Pfannenstiel skin incision.   SURGEON: Vena AustriaAndreas Athene Schuhmacher, MD  ASSISTANT: Senaida LangeLashawn Weaver-Lee, MD   ANESTHESIA USED: Spinal.  ESTIMATED BLOOD LOSS: 600 mL.   IV FLUIDS: 1 liter.   URINE OUTPUT: 50 mL of clear urine.   DRAINS OR TUBES: Foley to gravity drainage, On-Q catheter system.   IMPLANTS: None.   SPECIMENS REMOVED: None.   INTRAOPERATIVE FINDINGS: Normal tubes and ovaries, a posterior fibroid measuring approximately 4 cm and several small subcentimeter subserosal fibroids were also seen. Delivery resulted in the birth of a liveborn female infant weighing 3300 grams, 7 pounds 4 ounces, Apgars 9 and 9.  COMPLICATIONS: None.   PATIENT CONDITION FOLLOWING PROCEDURE: Stable.   PROCEDURE IN DETAIL: Risks, benefits and alternatives of the procedure were discussed with the patient prior to proceeding to the operating room. The patient was taken to the operating room where spinal anesthesia was administered. She was positioned in the supine position, prepped and draped in the usual sterile fashion. A timeout procedure was performed and level of anesthetic was noted to be acceptable. A Pfannenstiel skin incision was then made 2 cm above the pubic symphysis and carried down sharply to the level of the rectus fascia. The fascia was incised in the midline using a knife and the fascial incision extended using Mayo scissors. The superior border of the rectus fascia was then grasped with two  Kocher clamps and the fascia was dissected off the underlying rectus muscle using blunt dissection, the median raphe was incised using Mayo scissors. The inferior border of the rectus fascia was dissected off the rectus muscle in a similar fashion. The midline was identified. The peritoneum was entered bluntly. The peritoneal incision was extended using manual traction. A bladder blade was placed and a bladder flap was created using Metzenbaum scissors and digital dissection. The bladder blade was then replaced displacing the bladder caudad. A low transverse uterine incision was then made using a #10 scalpel. The uterus was entered bluntly using the operator's finger. Clear fluid was noted. Upon placing the operator's hand into the hysterotomy incision, the infant was noted to be in the OA position. The vertex was grasped, flexed, brought to the incision and delivered atraumatically using fundal pressure. Cord was clamped and cut. The infant was suctioned before being passed to awaiting pediatricians. Cord blood was obtained. The placenta was delivered using manual extraction. The uterus was then exteriorized and wiped clean of clots and debris using 2 moist laps. The hysterotomy incision was then closed using a two-layer closure of 0 Vicryl with the first being a running locked, the second a vertical imbricating. The uterus was returned to the abdomen. The abdomen was wiped clean of clots and debris. The superior border of the rectus fascia was then grasped with Kocher clamps. The On-Q catheters were placed superior, in the midline, per protocol. Following the On-Q catheter placement, the fascia was closed using a running #1 looped PDS. The subcutaneous tissue was irrigated. Hemostasis was achieved using the Bovie. Skin was closed  using Insorb  staples. The incision and the On-Q catheters were then dressed with Dermabond. Each On-Q catheter was then bolused with 5 mL of 0.5% bupivacaine each. Sponge, needle and  instrument counts were correct x 2.  ____________________________ Florina Ou. Bonney Aid, MD ams:sb D: 04/17/2012 07:52:00 ET     T: 04/17/2012 10:14:17 ET       JOB#: [id number] cc: Florina Ou. Bonney Aid, MD, <Dictator> Lorrene Reid MD ELECTRONICALLY SIGNED 04/17/2012 20:41
